# Patient Record
Sex: Male | Born: 1995 | Race: Black or African American | Hispanic: No | Marital: Single | Smoking: Never smoker
Health system: Southern US, Community
[De-identification: ages and names within clinical notes are randomized; demographics above are authoritative.]

---
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
## 2020-12-20 ENCOUNTER — Encounter (HOSPITAL_COMMUNITY): Payer: Self-pay | Admitting: Emergency Medicine

## 2020-12-20 ENCOUNTER — Emergency Department (HOSPITAL_COMMUNITY): Payer: Self-pay

## 2020-12-20 ENCOUNTER — Emergency Department (HOSPITAL_COMMUNITY)
Admission: EM | Admit: 2020-12-20 | Discharge: 2020-12-20 | Disposition: A | Discharge status: Home / Self Care | Payer: Self-pay | Attending: Emergency Medicine | Admitting: Emergency Medicine

## 2020-12-20 ENCOUNTER — Other Ambulatory Visit: Payer: Self-pay

## 2020-12-20 DIAGNOSIS — R110 Nausea: Secondary | ICD-10-CM | POA: Insufficient documentation

## 2020-12-20 DIAGNOSIS — R519 Headache, unspecified: Secondary | ICD-10-CM

## 2020-12-20 DIAGNOSIS — Y900 Blood alcohol level of less than 20 mg/100 ml: Secondary | ICD-10-CM | POA: Insufficient documentation

## 2020-12-20 DIAGNOSIS — R42 Dizziness and giddiness: Secondary | ICD-10-CM | POA: Insufficient documentation

## 2020-12-20 DIAGNOSIS — Z79899 Other long term (current) drug therapy: Secondary | ICD-10-CM | POA: Insufficient documentation

## 2020-12-20 DIAGNOSIS — Z87891 Personal history of nicotine dependence: Secondary | ICD-10-CM | POA: Insufficient documentation

## 2020-12-20 LAB — ETHANOL: Alcohol, Ethyl (B): <10 mg/dL (ref <10)

## 2020-12-20 LAB — COMPREHENSIVE METABOLIC PANEL
ALT: 15 U/L (ref 0–44)
AST: 26 U/L (ref 15–41)
Albumin: 4.7 g/dL (ref 3.5–5.0)
Alkaline Phosphatase: 57 U/L (ref 38–126)
Anion gap: 8 (ref 5–15)
BUN: 12 mg/dL (ref 6–20)
CO2: 28 mmol/L (ref 22–32)
Calcium: 9.8 mg/dL (ref 8.9–10.3)
Chloride: 102 mmol/L (ref 98–111)
Creatinine, Ser: 0.85 mg/dL (ref 0.61–1.24)
GFR, Estimated: >60 mL/min (ref >60)
Glucose, Bld: 95 mg/dL (ref 70–99)
Potassium: 4.4 mmol/L (ref 3.5–5.1)
Sodium: 138 mmol/L (ref 135–145)
Total Bilirubin: 0.9 mg/dL (ref 0.3–1.2)
Total Protein: 7.8 g/dL (ref 6.5–8.1)

## 2020-12-20 LAB — CBC WITH DIFFERENTIAL/PLATELET
Abs Immature Granulocytes: 0.01 10*3/uL (ref 0.00–0.07)
Basophils Absolute: 0 10*3/uL (ref 0.0–0.1)
Basophils Relative: 1 %
Eosinophils Absolute: 0 10*3/uL (ref 0.0–0.5)
Eosinophils Relative: 1 %
HCT: 43.5 % (ref 39.0–52.0)
Hemoglobin: 14.2 g/dL (ref 13.0–17.0)
Immature Granulocytes: 0 %
Lymphocytes Relative: 39 %
Lymphs Abs: 1.3 10*3/uL (ref 0.7–4.0)
MCH: 27.4 pg (ref 26.0–34.0)
MCHC: 32.6 g/dL (ref 30.0–36.0)
MCV: 84 fL (ref 80.0–100.0)
Monocytes Absolute: 0.3 10*3/uL (ref 0.1–1.0)
Monocytes Relative: 9 %
Neutro Abs: 1.7 10*3/uL (ref 1.7–7.7)
Neutrophils Relative %: 50 %
Platelets: 390 10*3/uL (ref 150–400)
RBC: 5.18 MIL/uL (ref 4.22–5.81)
RDW: 12.6 % (ref 11.5–15.5)
WBC: 3.4 10*3/uL — ABNORMAL LOW (ref 4.0–10.5)
nRBC: 0 % (ref 0.0–0.2)

## 2020-12-20 LAB — RAPID URINE DRUG SCREEN, HOSP PERFORMED
Amphetamines: NOT DETECTED
Barbiturates: NOT DETECTED
Benzodiazepines: NOT DETECTED
Cocaine: NOT DETECTED
Opiates: NOT DETECTED
Tetrahydrocannabinol: NOT DETECTED

## 2020-12-20 LAB — CBG MONITORING, ED: Glucose-Capillary: 96 mg/dL (ref 70–99)

## 2020-12-20 NOTE — Discharge Instructions (Signed)
Your CT scan and blood work were normal.  Drink plenty fluids and get some rest at home.  Call your primary care doctor or specialist as discussed in the next 2-3 days.   Return immediately back to the ER if:  Your symptoms worsen within the next 12-24 hours. You develop new symptoms such as new fevers, persistent vomiting, new pain, shortness of breath, or new weakness or numbness, or if you have any other concerns.

## 2020-12-20 NOTE — ED Provider Notes (Signed)
Emergency Medicine Provider Triage Evaluation Note  Glenn Bond , a 25 y.o. male  was evaluated in triage.  Pt complains of near syncopal event and headache. He states that he went to work today and developed a severe right-sided headache at about 620 this morning.  He notes that he has not eaten today, he states he did have a few sips of water earlier today.Marland Kitchen  He denies any vision changes or weakness.  He feels lightheaded like he is going to pass out.  He denies any recent trauma or drug use.  He denies any chest pain or neck pain.  His headache has been gradually worsening.  Concern today primarily was the headache in the setting of near syncopal event.  Denies any drug use or regular alcohol use.   Review of Systems  Positive: Headache, lightheadedness, nauseated Negative: Fever, trauma, vision changes  Physical Exam  BP 126/85 (BP Location: Left Arm)   Pulse 61   Temp 98.1 F (36.7 C) (Oral)   Resp 14   Ht 5\' 8"  (1.727 m)   Wt 65.8 kg   SpO2 100%   BMI 22.05 kg/m  Gen:   Awake, slower to respond. Resp:  Normal effort  MSK:   Moves extremities without difficulty, no pronator drift.  Able to lift bilateral legs and hold them against gravity without difficulty Other:  Speech is not slurred.  Questionable swelling in the upper lip however patient denies that this is swollen.  Normal finger nose finger bilaterally however is very slow with doing so.   Medical Decision Making  Medically screening exam initiated at 12:37 PM.  Appropriate orders placed.  Glenn Bond was informed that the remainder of the evaluation will be completed by another provider, this initial triage assessment does not replace that evaluation, and the importance of remaining in the ED until their evaluation is complete.  Note: Portions of this report may have been transcribed using voice recognition software. Every effort was made to ensure accuracy; however, inadvertent computerized transcription errors may be  present    Cherylann Banas, PA-C 12/20/20 1249    02/19/21, MD 12/20/20 [phone]

## 2020-12-20 NOTE — ED Provider Notes (Signed)
[facility] EMERGENCY DEPARTMENT Provider Note   CSN: [id number] Arrival date & time: 12/20/20  1226     History Chief Complaint  Patient presents with   Nausea   Dizziness    Glenn Bond is a 25 y.o. male.  Patient presents to ER chief complaint of headache lightheadedness and dizziness.  He states that he woke up fine and went to work at [employer] around 6 or 7 AM.  He stated he was feeling fine without headache or pain.  He developed what he describes as a small headache in the morning.  However this persisted throughout the day and continue to increase in intensity to about 7 out of 10.  He grew lightheaded and dizzy which he states is not usual for him.  Denies any episode of thunderclap headache type presentation.  Currently headache is gone he no longer has any pain but feels lightheaded.  No recent illnesses reported no fever no cough no vomiting or diarrhea.      History reviewed. No pertinent past medical history.  There are no problems to display for this patient.   History reviewed. No pertinent surgical history.     No family history on file.  Social History   Tobacco Use   Smoking status: Never   Smokeless tobacco: Former  Substance Use Topics   Alcohol use: Yes   Drug use: No    Home Medications Prior to Admission medications   Medication Sig Start Date End Date Taking? Authorizing Provider  ciprofloxacin-dexamethasone (CIPRODEX) otic suspension Place 4 drops into the right ear 2 (two) times daily. 01/07/13   Marlon Pel, PA-C    Allergies    Patient has no known allergies.  Review of Systems   Review of Systems  Constitutional:  Negative for fever.  HENT:  Negative for ear pain and sore throat.   Eyes:  Negative for pain.  Respiratory:  Negative for cough.   Cardiovascular:  Negative for chest pain.  Gastrointestinal:  Negative for abdominal pain.  Genitourinary:  Negative for flank pain.  Musculoskeletal:  Negative for  back pain.  Skin:  Negative for color change and rash.  Neurological:  Positive for headaches. Negative for syncope.  All other systems reviewed and are negative.  Physical Exam Updated Vital Signs BP 126/85 (BP Location: Left Arm)   Pulse 61   Temp 98.1 F (36.7 C) (Oral)   Resp 14   Ht 5\' 8"  (1.727 m)   Wt 65.8 kg   SpO2 100%   BMI 22.05 kg/m   Physical Exam Constitutional:      Appearance: He is well-developed.  HENT:     Head: Normocephalic.     Nose: Nose normal.  Eyes:     Extraocular Movements: Extraocular movements intact.  Cardiovascular:     Rate and Rhythm: Normal rate.  Pulmonary:     Effort: Pulmonary effort is normal.  Skin:    Coloration: Skin is not jaundiced.  Neurological:     General: No focal deficit present.     Mental Status: He is alert and oriented to person, place, and time. Mental status is at baseline.     Cranial Nerves: No cranial nerve deficit.     Motor: No weakness.     Gait: Gait normal.    ED Results / Procedures / Treatments   Labs (all labs ordered are listed, but only abnormal results are displayed) Labs Reviewed  CBC WITH DIFFERENTIAL/PLATELET - Abnormal; Notable for the following  components:      Result Value   WBC 3.4 (*)    All other components within normal limits  COMPREHENSIVE METABOLIC PANEL  ETHANOL  RAPID URINE DRUG SCREEN, HOSP PERFORMED  CBG MONITORING, ED    EKG EKG Interpretation  Date/Time:  Friday December 20 2020 12:31:29 EDT Ventricular Rate:  61 PR Interval:  172 QRS Duration: 100 QT Interval:  386 QTC Calculation: 388 R Axis:   48 Text Interpretation: Normal sinus rhythm ST elevation, consider early repolarization Borderline ECG Confirmed by Norman Clay ([id number]) on 12/20/2020 1:57:23 PM  Radiology CT Head Wo Contrast  Result Date: 12/20/2020 CLINICAL DATA:  Mental status change.  Nausea and dizziness. EXAM: CT HEAD WITHOUT CONTRAST TECHNIQUE: Contiguous axial images were obtained from the base  of the skull through the vertex without intravenous contrast. COMPARISON:  None. FINDINGS: Brain: No evidence of acute infarction, hemorrhage, hydrocephalus, extra-axial collection or mass lesion/mass effect. Vascular: No hyperdense vessel or unexpected calcification. Skull: Normal. Negative for fracture or focal lesion. Sinuses/Orbits: No acute finding. Other: None IMPRESSION: No acute intracranial abnormalities. Normal brain. Electronically Signed   By: Signa Kell M.D.   On: 12/20/2020 13:48    Procedures Procedures   Medications Ordered in ED Medications - No data to display  ED Course  I have reviewed the triage vital signs and the nursing notes.  Pertinent labs & imaging results that were available during my care of the patient were reviewed by me and considered in my medical decision making (see chart for details).    MDM Rules/Calculators/A&P                           Exam is benign patient neuro intact vital signs within normal limits.  Imaging includes CT imaging which is unremarkable labs are normal as well EKG shows sinus rhythm no ST elevation or depressions normal rate.  Patient has no focal neurodeficit ambulatory without assistance.  Discharged home in stable condition advised to rest from work.  He states that he is working too hard and too many days and feels like he needs to take a break.  Current pain is 0/10.     Final Clinical Impression(s) / ED Diagnoses Final diagnoses:  Dizziness  Nonintractable headache, unspecified chronicity pattern, unspecified headache type    Rx / DC Orders ED Discharge Orders     None        Cheryll Cockayne, MD 12/20/20 1455

## 2020-12-20 NOTE — ED Triage Notes (Signed)
Pt here with c/o sudden on set of dizziness while at work. He went home and symptoms continued along with a headache 7/10. He seems to be concerned and states "this has never happened to me." Denies visual disturbance, chest pain, nor substance abuse.

## 2022-11-19 IMAGING — CT CT HEAD W/O CM
4 series · 17 of 47 positions shown, 19 images · non-contrast
Comparison: None.

CLINICAL DATA: Mental status change.  Nausea and dizziness.

EXAM:
CT HEAD WITHOUT CONTRAST
TECHNIQUE: Contiguous axial images were obtained from the base of the skull
through the vertex without intravenous contrast.

[Series 2: head wo · axial · 0.45mm/px · z∈[-50,+65]mm · 7 of 31 slices shown, 9 images]
[im 4/31  brain]
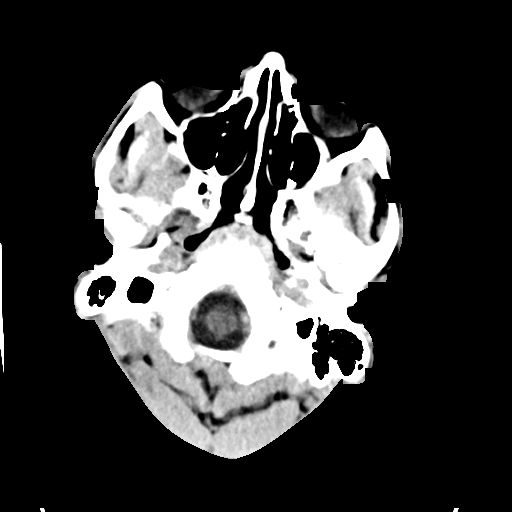
[im 4/31  bone]
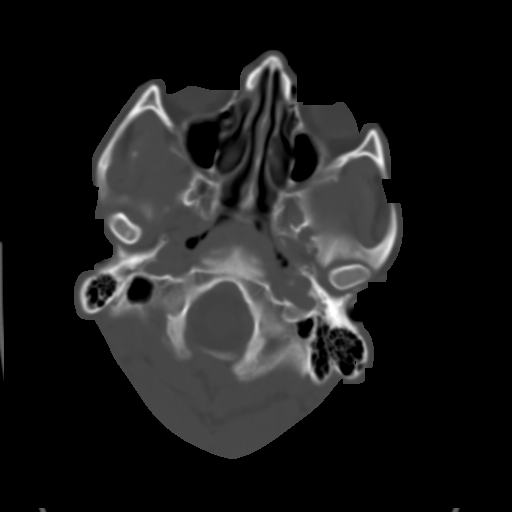
[im 8/31  brain]
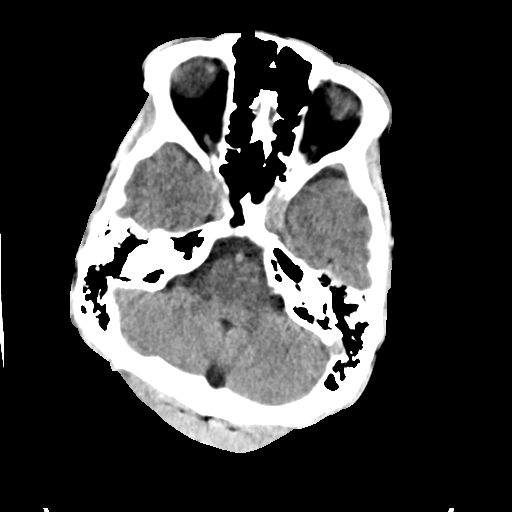
[im 12/31  brain]
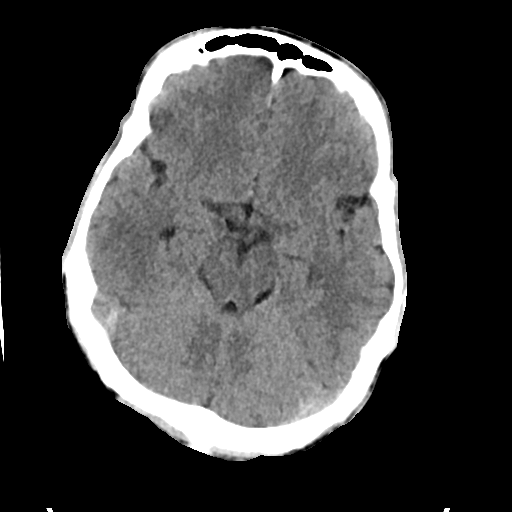
[im 16/31  brain]
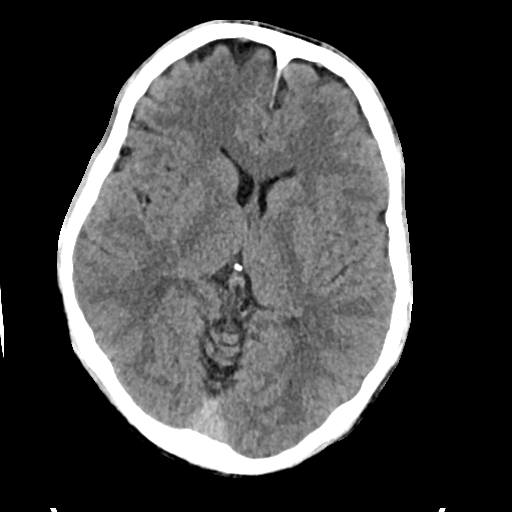
[im 19/31  brain]
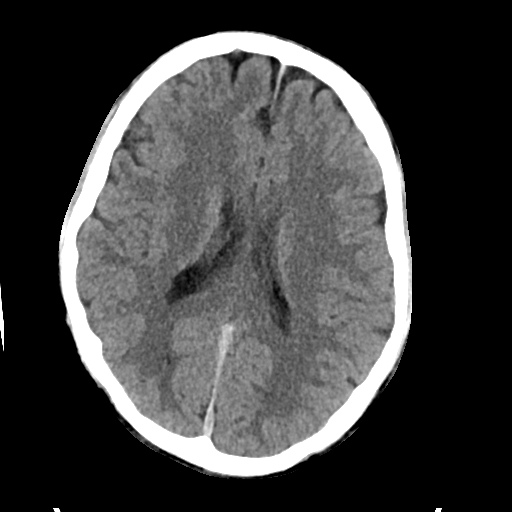
[im 19/31  bone]
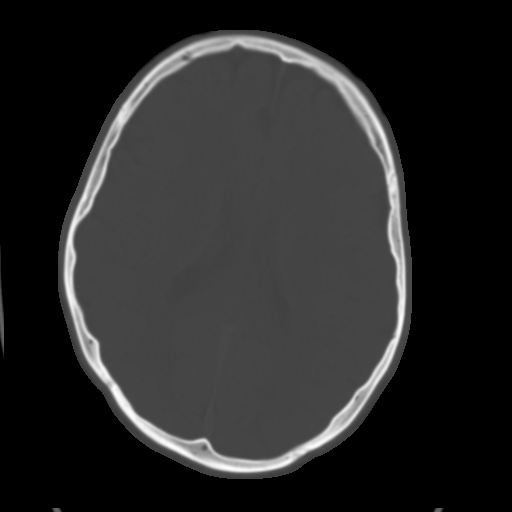
[im 23/31  brain]
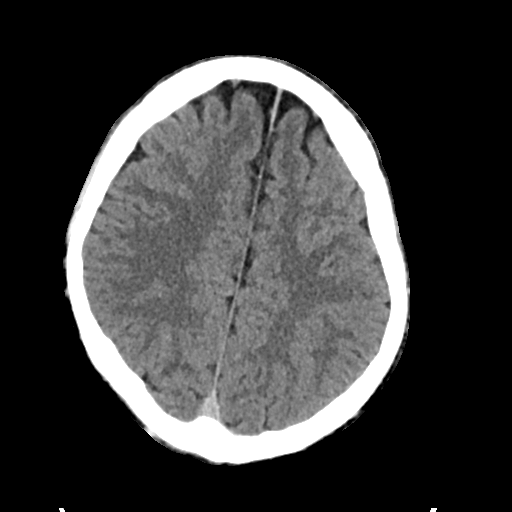
[im 27/31  brain]
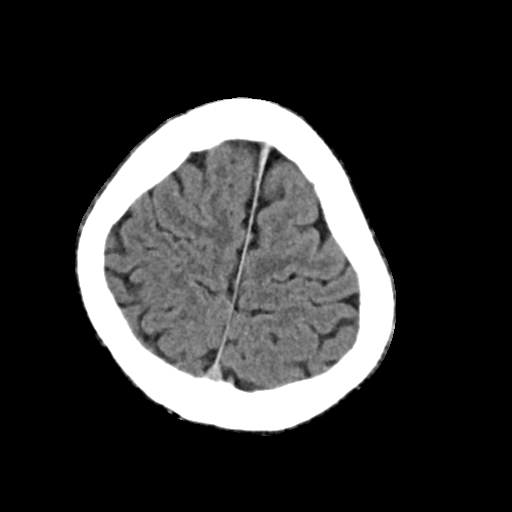

[Series 3: head bone · axial · 0.45mm/px · z∈[-51,+3]mm · 4 of 78 slices shown]
[im 8/78  bone]
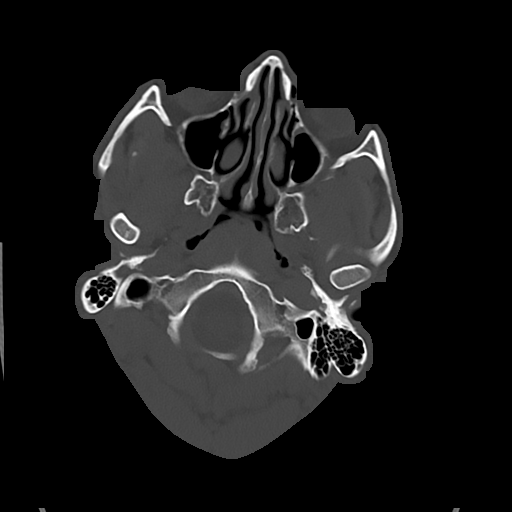
[im 16/78  bone]
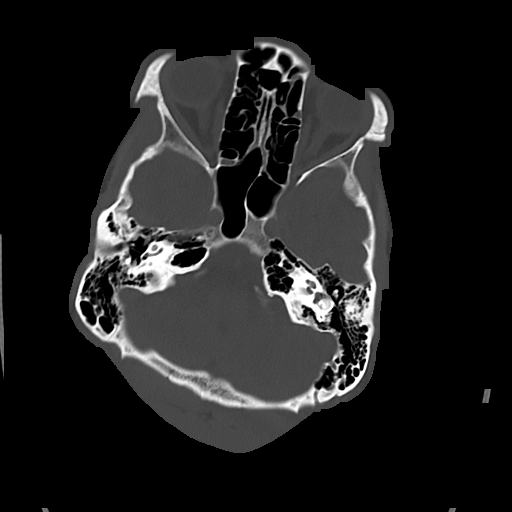
[im 24/78  bone]
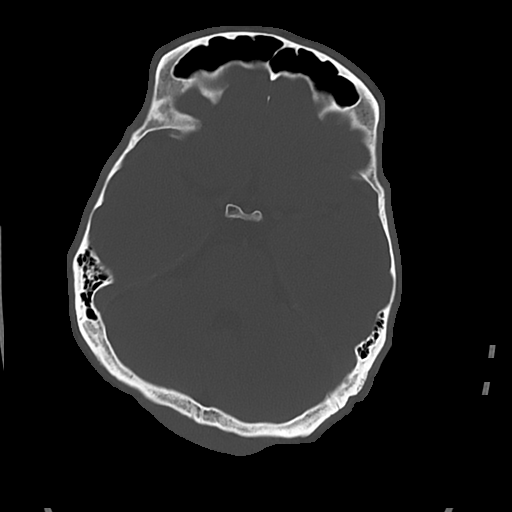
[im 35/78  bone]
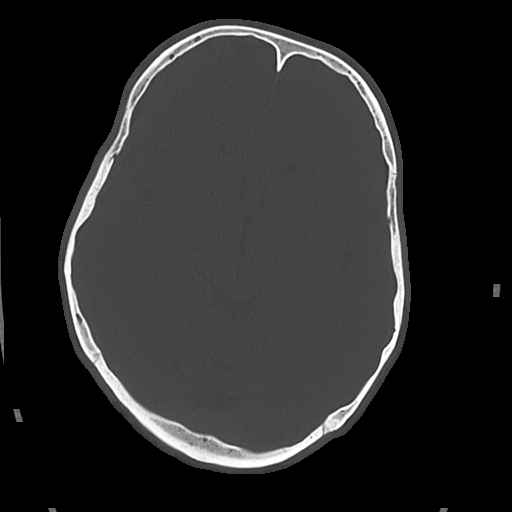

[Series 4: cor soft · coronal · 0.33mm/px · 3 of 71 slices shown]
[im 24/71  brain]
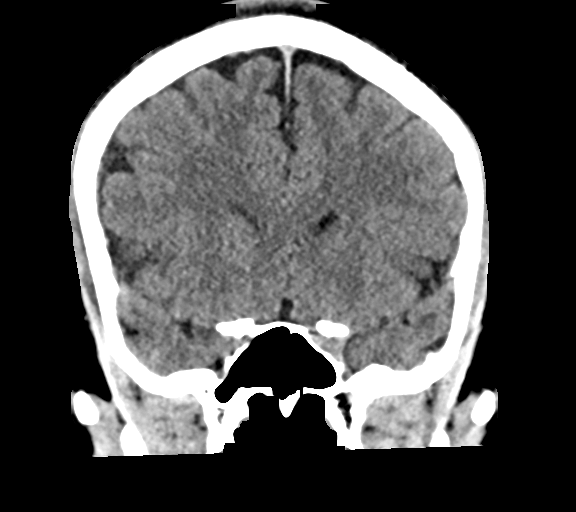
[im 32/71  brain]
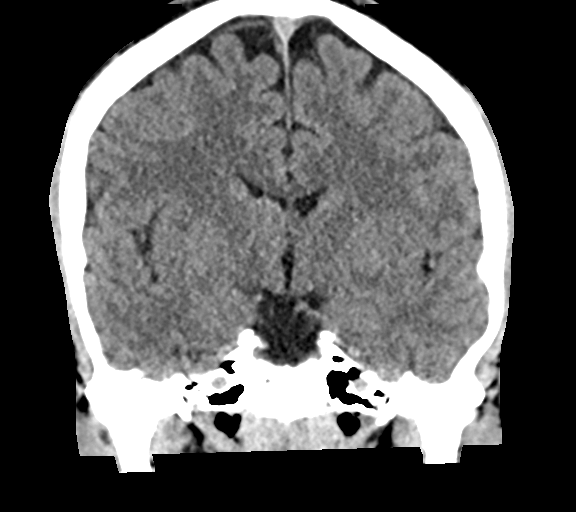
[im 39/71  brain]
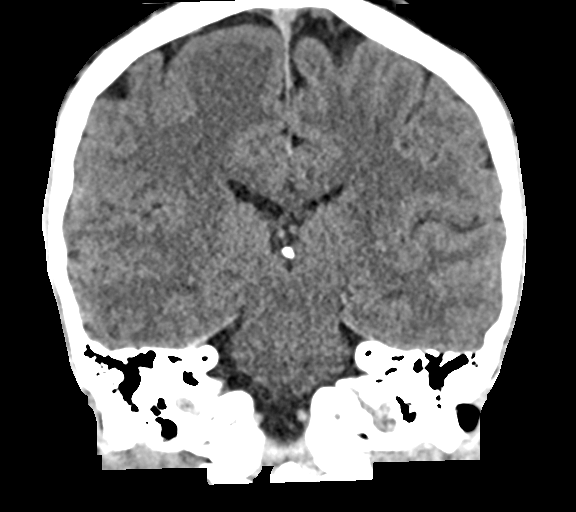

[Series 5: sag soft · sagittal · 0.33mm/px · 3 of 64 slices shown]
[im 22/64  brain]
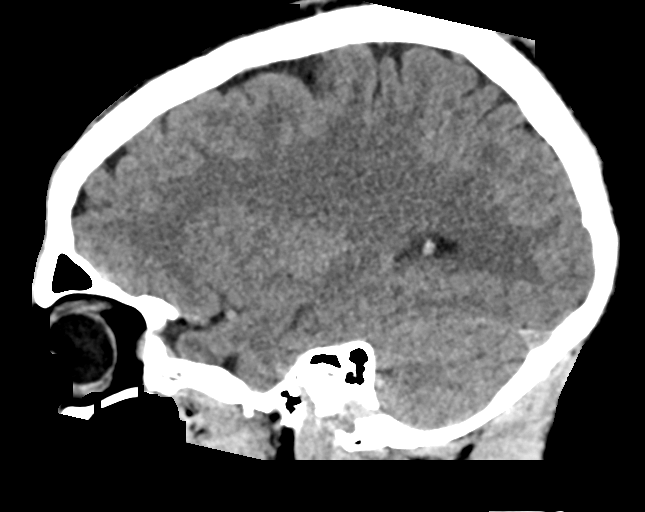
[im 32/64  brain]
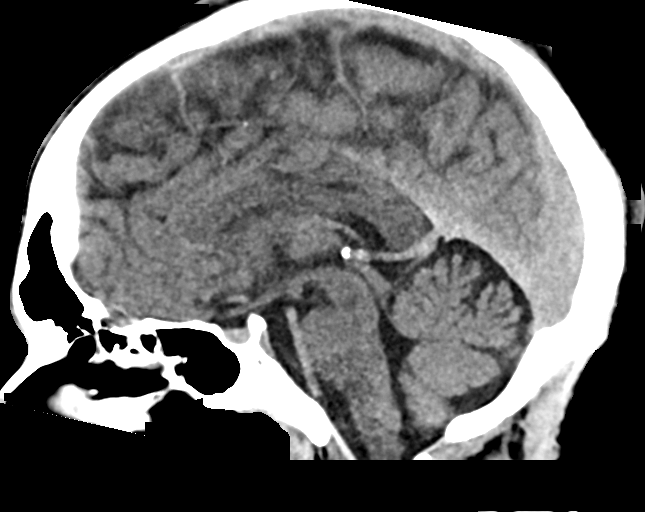
[im 43/64  brain]
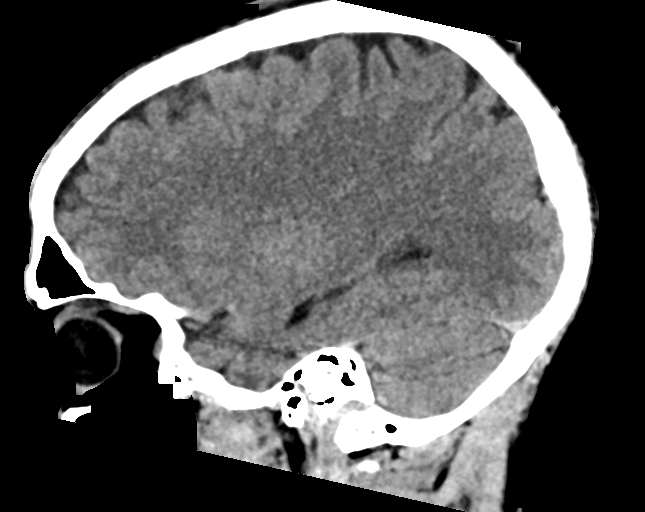

[17 of 47 positions shown; findings below may reference images not displayed]

FINDINGS: Brain: No evidence of acute infarction, hemorrhage, hydrocephalus,
extra-axial collection or mass lesion/mass effect.

Vascular: No hyperdense vessel or unexpected calcification.

Skull: Normal. Negative for fracture or focal lesion.

Sinuses/Orbits: No acute finding.

Other: None
IMPRESSION: No acute intracranial abnormalities. Normal brain.
